# Patient Record
Sex: Male | Born: 2005 | Hispanic: Yes | Marital: Single | Smoking: Never smoker
Health system: Southern US, Community
[De-identification: ages and names within clinical notes are randomized; demographics above are authoritative.]

## PROBLEM LIST (undated history)

## (undated) ENCOUNTER — Ambulatory Visit: Source: Home / Self Care

## (undated) ENCOUNTER — Ambulatory Visit: Payer: Self-pay

## (undated) DIAGNOSIS — J45909 Unspecified asthma, uncomplicated: Secondary | ICD-10-CM

## (undated) DIAGNOSIS — R011 Cardiac murmur, unspecified: Secondary | ICD-10-CM

---
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
## 2023-11-14 ENCOUNTER — Ambulatory Visit: Payer: Self-pay | Admitting: Nurse Practitioner

## 2023-11-14 ENCOUNTER — Ambulatory Visit (INDEPENDENT_AMBULATORY_CARE_PROVIDER_SITE_OTHER): Payer: Self-pay

## 2023-11-14 ENCOUNTER — Ambulatory Visit
Admission: EM | Admit: 2023-11-14 | Discharge: 2023-11-14 | Disposition: A | Discharge status: Home / Self Care | Payer: Self-pay | Attending: Family Medicine | Admitting: Family Medicine

## 2023-11-14 DIAGNOSIS — T148XXA Other injury of unspecified body region, initial encounter: Secondary | ICD-10-CM

## 2023-11-14 DIAGNOSIS — M79622 Pain in left upper arm: Secondary | ICD-10-CM

## 2023-11-14 MED ORDER — NAPROXEN 375 MG PO TABS: 375.0000 mg | ORAL_TABLET | Freq: Two times a day (BID) | ORAL | 0 refills | Status: AC | PRN

## 2023-11-14 NOTE — ED Provider Notes (Addendum)
 [facility]    CSN: [id number] Arrival date & time: 11/14/23  9057      History   Chief Complaint No chief complaint on file.   HPI Mitchell Lindsey is a 18 y.o. male presents for arm pain.  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  He states he was not at work when this occurred.  He states he slipped through some of the wooden planks and fell onto his left upper arm.  He denies head injury or LOC.  Reports since that time he has been having left upper lateral arm pain.  Denies any swelling, bruising or numbness or tingling.  Does have reduced range of motion due to the pain.  He denies any shoulder pain or elbow pain.  No history of injuries or surgeries to the area.  He has been using an over-the-counter sling for symptoms but has not taken any OTC medications.  No other injuries or concerns at this time.  HPI  Past Medical History:  Diagnosis Date   Asthma    Heart murmur    observing    There are no active problems to display for this patient.   History reviewed. No pertinent surgical history.     Home Medications    Prior to Admission medications   Medication Sig Start Date End Date Taking? Authorizing Provider  naproxen  (NAPROSYN ) 375 MG tablet Take 1 tablet (375 mg total) by mouth 2 (two) times daily as needed. 11/14/23  Yes Moncia Annas, Jodi R, NP  ondansetron  (ZOFRAN -ODT) 4 MG disintegrating tablet Take 1 tablet (4 mg total) by mouth every 8 (eight) hours as needed for nausea or vomiting. Patient not taking: Reported on 04/10/2018 02/09/18   Baxter Franky, MD  polyethylene glycol powder (MIRALAX ) powder Take 17 g by mouth 2 (two) times daily. Patient not taking: Reported on 04/10/2018 02/09/18   Baxter Franky, MD    Family History History reviewed. No pertinent family history.  Social History Social History   Tobacco Use   Smoking status: Never   Smokeless tobacco: Never  Vaping Use   Vaping  status: Never Used  Substance Use Topics   Alcohol use: Never   Drug use: Never     Allergies   Patient has no known allergies.   Review of Systems Review of Systems  Musculoskeletal:        Left upper arm pain     Physical Exam Triage Vital Signs ED Triage Vitals  Encounter Vitals Group     BP 11/14/23 1006 123/75     Girls Systolic BP Percentile --      Girls Diastolic BP Percentile --      Boys Systolic BP Percentile --      Boys Diastolic BP Percentile --      Pulse Rate 11/14/23 1006 61     Resp 11/14/23 1006 16     Temp 11/14/23 1006 98.3 F (36.8 C)     Temp Source 11/14/23 1006 Oral     SpO2 11/14/23 1006 98 %     Weight --      Height --      Head Circumference --      Peak Flow --      Pain Score 11/14/23 0959 8     Pain Loc --      Pain Education --      Exclude from Growth Chart --    No  data found.  Updated Vital Signs BP 123/75 (BP Location: Right Arm)   Pulse 61   Temp 98.3 F (36.8 C) (Oral)   Resp 16   SpO2 98%   Visual Acuity Right Eye Distance:   Left Eye Distance:   Bilateral Distance:    Right Eye Near:   Left Eye Near:    Bilateral Near:     Physical Exam Vitals and nursing note reviewed.  Constitutional:      General: He is not in acute distress.    Appearance: Normal appearance. He is not ill-appearing.  HENT:     Head: Normocephalic and atraumatic.  Eyes:     Pupils: Pupils are equal, round, and reactive to light.  Cardiovascular:     Rate and Rhythm: Normal rate.  Pulmonary:     Effort: Pulmonary effort is normal.  Musculoskeletal:     Left upper arm: Tenderness present. No swelling, edema, deformity, lacerations or bony tenderness.       Arms:     Comments: There is no tenderness with palpation to clavicle, lateral/anterior/posterior shoulder.  Reduced range of motion with frontal and lateral raise to about 90 degrees due to pain.  Negative Nyle Gelineau test.  Strength is 5 out of 5 bilateral upper  extremities.  Skin:    General: Skin is warm and dry.  Neurological:     General: No focal deficit present.     Mental Status: He is alert and oriented to person, place, and time.  Psychiatric:        Mood and Affect: Mood normal.        Behavior: Behavior normal.      UC Treatments / Results  Labs (all labs ordered are listed, but only abnormal results are displayed) Labs Reviewed - No data to display  EKG   Radiology No results found.  Procedures Procedures (including critical care time)  Medications Ordered in UC Medications - No data to display  Initial Impression / Assessment and Plan / UC Course  I have reviewed the triage vital signs and the nursing notes.  Pertinent labs & imaging results that were available during my care of the patient were reviewed by me and considered in my medical decision making (see chart for details).     Reviewed exam and symptoms with patient.  No red flags.  Wet read of x-ray negative for fracture.  Will contact for any positive results based on radiology overread once available.  Discussed likely soft tissue injury/contusion and RICE therapy.  Rx naproxen  twice daily as needed.  Advised he can use a sling for support but instructed not to keep shoulder immobile as this can cause other issues.  He may use ice as well.  Advised PCP follow-up if symptoms do not improve.  ER precautions reviewed. Final Clinical Impressions(s) / UC Diagnoses   Final diagnoses:  Left upper arm pain  Contusion of soft tissue     Discharge Instructions      You may take naproxen  twice daily as needed for your upper arm pain.  Cool compresses to the area as needed.  You may use a sling as needed for support but do not keep your shoulder immobile.  You want to continue to move the shoulder frequently to help prevent other complications.  Please follow-up with your PCP if your symptoms do not improve.  Please go to the ER for any worsening symptoms.  Hope you  feel better soon!     ED  Prescriptions     Medication Sig Dispense Auth. Provider   naproxen  (NAPROSYN ) 375 MG tablet Take 1 tablet (375 mg total) by mouth 2 (two) times daily as needed. 14 tablet Saprina Chuong, Jodi R, NP      PDMP not reviewed this encounter.   Loreda Myla SAUNDERS, NP 11/14/23 1102    Loreda Myla SAUNDERS, NP 11/14/23 1145

## 2023-11-14 NOTE — ED Triage Notes (Addendum)
 Pt presents to UC for c/o fall at work 4 days ago. 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 Pt states he slipped through some wooden planks and fell on his left arm about 4 feet. Taking ibuprofen for pain.

## 2023-11-14 NOTE — Discharge Instructions (Addendum)
 You may take naproxen  twice daily as needed for your upper arm pain.  Cool compresses to the area as needed.  You may use a sling as needed for support but do not keep your shoulder immobile.  You want to continue to move the shoulder frequently to help prevent other complications.  Please follow-up with your PCP if your symptoms do not improve.  Please go to the ER for any worsening symptoms.  Hope you feel better soon!
